# Patient Record
Sex: Male | Born: 1985 | Race: White | Hispanic: No | Marital: Married
Health system: Southern US, Community
[De-identification: ages and names within clinical notes are randomized; demographics above are authoritative.]

---

## 2019-07-03 ENCOUNTER — Other Ambulatory Visit: Payer: Self-pay

## 2019-07-03 ENCOUNTER — Encounter (HOSPITAL_BASED_OUTPATIENT_CLINIC_OR_DEPARTMENT_OTHER): Payer: Self-pay

## 2019-07-03 ENCOUNTER — Emergency Department (HOSPITAL_BASED_OUTPATIENT_CLINIC_OR_DEPARTMENT_OTHER): Payer: Self-pay

## 2019-07-03 ENCOUNTER — Emergency Department (HOSPITAL_BASED_OUTPATIENT_CLINIC_OR_DEPARTMENT_OTHER)
Admission: EM | Admit: 2019-07-03 | Discharge: 2019-07-03 | Disposition: A | Discharge status: Home / Self Care | Payer: Self-pay | Attending: Emergency Medicine | Admitting: Emergency Medicine

## 2019-07-03 DIAGNOSIS — R112 Nausea with vomiting, unspecified: Secondary | ICD-10-CM | POA: Insufficient documentation

## 2019-07-03 DIAGNOSIS — N23 Unspecified renal colic: Secondary | ICD-10-CM | POA: Insufficient documentation

## 2019-07-03 DIAGNOSIS — B356 Tinea cruris: Secondary | ICD-10-CM | POA: Insufficient documentation

## 2019-07-03 LAB — BASIC METABOLIC PANEL
Anion gap: 10 (ref 5–15)
BUN: 14 mg/dL (ref 6–20)
CO2: 26 mmol/L (ref 22–32)
Calcium: 9.4 mg/dL (ref 8.9–10.3)
Chloride: 103 mmol/L (ref 98–111)
Creatinine, Ser: 1.05 mg/dL (ref 0.61–1.24)
GFR calc Af Amer: >60 mL/min (ref >60)
GFR calc non Af Amer: >60 mL/min (ref >60)
Glucose, Bld: 140 mg/dL — ABNORMAL HIGH (ref 70–99)
Potassium: 4.4 mmol/L (ref 3.5–5.1)
Sodium: 139 mmol/L (ref 135–145)

## 2019-07-03 LAB — URINALYSIS, ROUTINE W REFLEX MICROSCOPIC
Bilirubin Urine: NEGATIVE
Glucose, UA: NEGATIVE mg/dL
Ketones, ur: NEGATIVE mg/dL
Leukocytes,Ua: NEGATIVE
Nitrite: NEGATIVE
Protein, ur: NEGATIVE mg/dL
Specific Gravity, Urine: 1.015 (ref 1.005–1.030)
pH: 8.5 — ABNORMAL HIGH (ref 5.0–8.0)

## 2019-07-03 LAB — CBC
HCT: 48.1 % (ref 39.0–52.0)
Hemoglobin: 15.2 g/dL (ref 13.0–17.0)
MCH: 27.4 pg (ref 26.0–34.0)
MCHC: 31.6 g/dL (ref 30.0–36.0)
MCV: 86.7 fL (ref 80.0–100.0)
Platelets: 312 10*3/uL (ref 150–400)
RBC: 5.55 MIL/uL (ref 4.22–5.81)
RDW: 12.2 % (ref 11.5–15.5)
WBC: 9.4 10*3/uL (ref 4.0–10.5)
nRBC: 0 % (ref 0.0–0.2)

## 2019-07-03 LAB — URINALYSIS, MICROSCOPIC (REFLEX)

## 2019-07-03 MED ORDER — ONDANSETRON HCL 4 MG/2ML IJ SOLN
INTRAMUSCULAR | Status: AC
  Administered 2019-07-03: 14:00:00 4 mg via INTRAVENOUS
  Filled 2019-07-03: qty 2

## 2019-07-03 MED ORDER — ONDANSETRON 4 MG PO TBDP: 4.0000 mg | ORAL_TABLET | Freq: Three times a day (TID) | ORAL | 0 refills | Status: AC | PRN

## 2019-07-03 MED ORDER — FENTANYL CITRATE (PF) 100 MCG/2ML IJ SOLN
INTRAMUSCULAR | Status: AC
  Administered 2019-07-03: 50 ug via INTRAVENOUS
  Filled 2019-07-03: qty 2

## 2019-07-03 MED ORDER — FENTANYL CITRATE (PF) 100 MCG/2ML IJ SOLN
50.0000 ug | INTRAMUSCULAR | Status: DC | PRN
  Administered 2019-07-03: 14:00:00 50 ug via INTRAVENOUS

## 2019-07-03 MED ORDER — CLOTRIMAZOLE 1 % EX CREA: TOPICAL_CREAM | CUTANEOUS | 0 refills | Status: AC

## 2019-07-03 MED ORDER — TRAMADOL HCL 50 MG PO TABS: 50.0000 mg | ORAL_TABLET | Freq: Four times a day (QID) | ORAL | 0 refills | Status: AC | PRN

## 2019-07-03 MED ORDER — ONDANSETRON HCL 4 MG/2ML IJ SOLN
4.0000 mg | Freq: Once | INTRAMUSCULAR | Status: AC
  Administered 2019-07-03: 14:00:00 4 mg via INTRAVENOUS

## 2019-07-03 MED FILL — CLOTRIMAZOLE 1% CREAM: 1 | 28 days supply | Qty: 28 | Fill #0

## 2019-07-03 MED FILL — traMADol HCL 50 MG TABS: 50 | 3 days supply | Qty: 15 | Fill #0

## 2019-07-03 MED FILL — ONDANSETRON ODT 4 MG TABLET: 4 | 6 days supply | Qty: 20 | Fill #0

## 2019-07-03 NOTE — ED Provider Notes (Signed)
[facility] EMERGENCY DEPARTMENT Provider Note   CSN: [id number] Arrival date & time: 07/03/19  1333     History   Chief Complaint Chief Complaint  Patient presents with   Flank Pain    HPI Jesse Cervantes is a 33 y.o. male   Abrupt onset of 10/10 crampy, constant left flank and left lower quadrant abdominal pain that is worse with movement that radiates into his groin.  Patient endorses "funny feeling"in his groin with a sensation of a mass in the area.  Endorses difficulty urinating, difficulty initiating stream, small volume of urine.  Endorses nausea and multiple episodes of vomiting that are nonbilious non-bloody.  No history of kidney stones no history of similar.  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  Denies repetitive movements or back issues in the past.  Denies fever, constipation, hematuria, numbness in his genital area, loss of bowel or bladder control, loss of sensation in lower extremities.     HPI  History reviewed. No pertinent past medical history.  There are no active problems to display for this patient.   History reviewed. No pertinent surgical history.      Home Medications    Prior to Admission medications   Medication Sig Start Date End Date Taking? Authorizing Provider  clotrimazole (LOTRIMIN) 1 % cream Apply to affected area 2 times daily 07/03/19   Inella Kuwahara S, PA  ondansetron (ZOFRAN ODT) 4 MG disintegrating tablet Take 1 tablet (4 mg total) by mouth every 8 (eight) hours as needed for nausea or vomiting. 07/03/19   Tedd Sias, PA  traMADol (ULTRAM) 50 MG tablet Take 1 tablet (50 mg total) by mouth every 6 (six) hours as needed. 07/03/19   Tedd Sias, PA    Family History No family history on file.  Social History Social History   Tobacco Use   Smoking status: Not on file  Substance Use Topics   Alcohol use:  Not on file   Drug use: Not on file     Allergies   Patient has no known allergies.   Review of Systems Review of Systems  All other systems reviewed and are negative.    Physical Exam Updated Vital Signs BP (!) 124/92 (BP Location: Left Arm)    Pulse 80    Temp 98.2 F (36.8 C)    Resp (!) 24    Ht 5\' 9"  (1.753 m)    Wt 82.1 kg    SpO2 98%    BMI 26.73 kg/m   Physical Exam Vitals signs and nursing note reviewed. Exam conducted with a chaperone present.  Constitutional:      Appearance: He is not ill-appearing.  HENT:     Head: Normocephalic and atraumatic.     Mouth/Throat:     Mouth: Mucous membranes are moist.  Eyes:     General: No scleral icterus. Neck:     Musculoskeletal: No neck rigidity.  Cardiovascular:     Rate and Rhythm: Normal rate and regular rhythm.     Pulses: Normal pulses.     Heart sounds: Normal heart sounds.  Pulmonary:     Effort: Pulmonary effort is normal.     Breath sounds: Normal breath sounds.  Abdominal:     General: Abdomen is flat. Bowel sounds are normal. There is no distension.     Palpations: Abdomen is soft.     Tenderness: There  is abdominal tenderness (Left lower quadrant and left flank). There is no right CVA tenderness, left CVA tenderness, guarding or rebound.     Comments: Tenderness to palpation of the left flank with negative CVA tenderness.  Genitourinary:    Comments: Intertriginous groin region is discolored, darker than surrounding skin with some flaky skin present.  No testicular pain, penile pain, no penile discharge  No hernia or mass on exam.  Mild tenderness to palpation in left groin. Musculoskeletal:     Right lower leg: No edema.     Left lower leg: No edema.  Skin:    General: Skin is warm and dry.     Capillary Refill: Capillary refill takes less than 2 seconds.  Neurological:     Mental Status: He is alert. Mental status is at baseline.  Psychiatric:        Behavior: Behavior normal.      ED  Treatments / Results  Labs (all labs ordered are listed, but only abnormal results are displayed) Labs Reviewed  URINALYSIS, ROUTINE W REFLEX MICROSCOPIC - Abnormal; Notable for the following components:      Result Value   pH 8.5 (*)    Hgb urine dipstick TRACE (*)    All other components within normal limits  BASIC METABOLIC PANEL - Abnormal; Notable for the following components:   Glucose, Bld 140 (*)    All other components within normal limits  URINALYSIS, MICROSCOPIC (REFLEX) - Abnormal; Notable for the following components:   Bacteria, UA RARE (*)    All other components within normal limits  CBC    EKG None  Radiology Ct Renal Stone Study  Result Date: 07/03/2019 CLINICAL DATA:  Pt c/o sudden onset of L flank pain followed by N/V. Pt c/o difficulty with urination, no other complaints EXAM: CT ABDOMEN AND PELVIS WITHOUT CONTRAST TECHNIQUE: Multidetector CT imaging of the abdomen and pelvis was performed following the standard protocol without IV contrast. COMPARISON:  None. FINDINGS: Lower chest: Small hiatal hernia. Somewhat limited evaluation of the abdominal parenchyma given lack of IV contrast. Hepatobiliary: No focal liver abnormality is seen. No gallstones, gallbladder wall thickening, or biliary dilatation. Pancreas: Unremarkable. Spleen: Normal in size without focal abnormality. Adrenals/Urinary Tract: Adrenal glands are unremarkable. Multiple punctate calculi in the right kidney. No right hydronephrosis. There are multiple punctate calculi in the left kidney. There is mild left hydronephrosis and mild dilation of the left ureter without an obstructing stone visualized. There is a 2 mm calculus in the bladder. Stomach/Bowel: Stomach is within normal limits. Appendix appears normal. No evidence of bowel wall thickening, distention, or inflammatory changes. Vascular/Lymphatic: No significant vascular findings are present. No enlarged abdominal or pelvic lymph nodes. Reproductive:  Prostate is unremarkable. Other: No abdominal wall hernia or abnormality. No abdominopelvic ascites. Musculoskeletal: No acute or significant osseous findings. IMPRESSION: 1. Mild left hydronephrosis and mild dilation of the left ureter without an obstructing stone visualized. There is a 2 mm calculus in the bladder, which may have recently passed from the left ureter. 2. Nonobstructing right nephrolithiasis. 3. Small hiatal hernia. Electronically Signed   By: Emmaline KluverNancy  Ballantyne M.D.   On: 07/03/2019 14:44    Procedures Procedures (including critical care time)  Medications Ordered in ED Medications  fentaNYL (SUBLIMAZE) injection 50 mcg (50 mcg Intravenous Given 07/03/19 1352)  ondansetron (ZOFRAN) injection 4 mg (4 mg Intravenous Given 07/03/19 1352)     Initial Impression / Assessment and Plan / ED Course  I have reviewed the  triage vital signs and the nursing notes.  Pertinent labs & imaging results that were available during my care of the patient were reviewed by me and considered in my medical decision making (see chart for details).        Patient is a 89-year-ol male presenting with left flank pain with radiation into his abdomen and pelvis with nausea and multiple episodes of vomiting  Suspect kidney stone.  CT scan to rule in.    Pain may also be due to muscular strain as there is palpable tenderness in the left flank without CVA tenderness. 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 Will recommend follow up with PCP for evaluation of this.   CT scan shows small stone in bladder likely passed recently. Mild left sided hydronephrosis and ureteral dilation likely persistent from recently passed kidney stone. Discussed with patient turn precautions including fever, intractable nausea vomiting, worsening pain or new or concerning symptoms.  Patient will follow-up with primary care within the week for reevaluation and  for evaluation of cronic tinea cruris--prescribed lotrimin for patient to use in the meantime.  Patient given recommendations for PCP.       The patient appears reasonably screened and/or stabilized for discharge and I doubt any other medical condition or other [facility] requiring further screening, evaluation, or treatment in the ED at this time prior to discharge.  Patient is hemodynamically stable, in NAD, and able to ambulate in the ED. Pain has been managed or a plan has been made for home management and has no complaints prior to discharge. Patient is comfortable with above plan and is stable for discharge at this time. All questions were answered prior to disposition. Results from the ER workup discussed with the patient face to face and all questions answered to the best of my ability. The patient is safe for discharge with strict return precautions. Patient appears safe for discharge with appropriate follow-up.  Conveyed my impression with the patient and he voiced understanding and is agreeable to plan.   An After Visit Summary was printed and given to the patient.  Portions of this note were generated with Scientist, clinical (histocompatibility and immunogenetics). Dictation errors may occur despite best attempts at proofreading.       Final Clinical Impressions(s) / ED Diagnoses   Final diagnoses:  Ureteral colic  Tinea cruris    ED Discharge Orders         Ordered    traMADol (ULTRAM) 50 MG tablet  Every 6 hours PRN     07/03/19 1514    ondansetron (ZOFRAN ODT) 4 MG disintegrating tablet  Every 8 hours PRN     07/03/19 1514    clotrimazole (LOTRIMIN) 1 % cream     07/03/19 1514           Gailen Shelter, [place] 07/03/19 1518    Raeford Razor, MD 07/04/19 [phone]

## 2019-07-03 NOTE — ED Notes (Signed)
ED Provider at bedside. 

## 2019-07-03 NOTE — ED Triage Notes (Signed)
Pt c/o sudden onset of L flank pain followed by N/V. Pt c/o difficulty with urination.

## 2019-07-03 NOTE — Discharge Instructions (Signed)
Please establish and follow-up with primary care provider within next week.  Please see attached resource guide.  Please use Zofran and tramadol for nausea and pain respectively.  Use Tylenol ibuprofen for pain control during the day.  Be aware that all may make you drowsy and should not be mixed with alcohol.  Please return if you have any fevers, intractable vomiting, worsening or concerning symptoms as discussed.    RESOURCE GUIDE   No Primary Care Doctor: Call [facility]  [phone] - can help you locate a primary care doctor that  accepts your insurance, provides certain services, etc. Physician Referral Service- [phone]  Agencies that provide inexpensive medical care: [facility]  [phone] [facility] Internal Medicine  [phone] [facility]  [phone] [facility]  [phone] [facility]  [phone] [facility]  [phone]  Chronic Pain Problems: Contact [facility]  [phone] Patients need to be referred by their primary care doctor.  Insufficient Money for Medicine: Contact United Way:  call "211" or [facility] [phone].  Medicaid-accepting [facility] Providers: [facility]- [address]  [phone], Mon-Fri 9am-7pm, Sat 9am-1pm [facility]- [address] [place], [address] [place]  [phone] [employer]- [address], [address] [place]  [phone] [facility]- [address]  [phone] Renaye Rakers- [address] [place], [address], [phone]  Only accepts [place] patients after they have their name  applied to their card  Self Pay (no insurance) in [facility]: Sickle Cell Patients: Dr Willey Blade, [facility] Internal Medicine  [address] [place], [phone] [facility]- [address] [place]  [phone]       [facility] [place]- [address], [address]       -     [facility]- see information above (Speak to [employer] if you do not have insurance)       -  [facility]- [address] [place], [phone]       -  [facility]- [address],  [phone]       -  [facility]- [address], [phone]       -  Dr Julio Sicks-  [address], [place], [phone]       -  [facility]- [address], [phone]       -  [facility]- [address], [phone], also [address] [phone]       -    [facility]- [address] [place], [phone], 1st & 3rd Saturday   every month, 10am-1pm  1) Find a Doctor and Pay Out of Pocket Although you won't have to find out who is covered by your insurance plan, it is a good idea to ask around and get recommendations. You will then need to call the office and see if the doctor you have chosen will accept you as a new patient and what types of options they offer for patients who are self-pay. Some doctors offer discounts or will set up payment plans for their patients who do not have insurance, but you will need to ask so you aren't surprised when you get to your appointment.  2) Contact Your Local Health Department Not all health departments have  doctors that can see patients for sick visits, but many do, so it is worth a call to see if yours does. If you don't know where your local health department is, you can check in your phone book. The CDC also has a tool to help you locate your state's health department, and many state websites also have listings of all of their local health departments.  3) Find a Walk-in Clinic If your illness is not likely to be very severe or complicated, you may want to try a walk in clinic. These are popping up all over the country in pharmacies, drugstores, and shopping centers. They're usually staffed by nurse practitioners or physician assistants that have been trained to treat common illnesses  and complaints. They're usually fairly quick and inexpensive. However, if you have serious medical issues or chronic medical problems, these are probably not your best option  STD Testing [facility] of [facility] [place], STD Clinic, [address], [place], phone [phone] or [phone].  Monday - Friday, call for an appointment. [facility] of [facility] [place], STD Clinic, [place], [place], phone [phone] or [phone].  Monday - Friday, call for an appointment.  Abuse/Neglect: [facility] Child Abuse Hotline [phone](336) 760-796-1961 [facility] Child Abuse Hotline (985)307-2127323-794-2927 (After Hours)  Emergency Shelter:  [facility] [phone](336) [phone]  Maternity Homes: [facility] [phone]([phone]) [phone] [facility] [phone]) [phone]  MRSA Hotline #:   (909)078-4391762 056 7649  [facility] Resources  [facility] [place]  United Way [facility]. [address]                 [address]         [address]  [place]                                               [place]                              [place] Phone:  [phone]                                  Phone:  [phone]                   Phone:  [phone]  [facility], [phone] [facility] Services - [facility]- [phone]       -     [facility] in [place], [address],                                  [phone], [facility]  [place] Child Abuse Hotline (870)195-9826(336) (306)422-6560 or (518)025-0232(336) 626-008-0051 (After Hours)   Behavioral Health Services  Substance Abuse Resources: [facility] Drug Services  [phone] [facility] [phone] [facility] [phone] [facility] [phone] Residential & Outpatient Substance Abuse Program  [phone]  Psychological Services: [facility]   [phone] [facility]  [phone] [facility] [place] [facility], [phone] [place] [address], [place], ACCESS LINE: [phone] or [phone], [email or web address]  Dental Assistance  If  unable to pay or uninsured, contact:  [place] or [facility]. to become qualified for the adult dental clinic.  Patients with Medicaid: [facility] [phone] [address], [place] [address] [phone]  If unable to pay, or uninsured, contact [facility] [phone]) or [place] [phone] in [place], [place] in [facility]) to become qualified for the adult dental clinic   Other [place]- [place], [place], [place], [address], [place], [place], 2nd and 4th Thursday of the month at 6:30am.  10 clients each day by appointment, can sometimes see walk-in patients if someone does not show for an appointment. [facility]- [address] [place]ard Danker [place], [place], [address], [place], [place], [place], [address], [place] Department- [phone] [place] [facility] [phone]

## 2020-12-28 IMAGING — CT CT RENAL STONE PROTOCOL
2 of 4 series · 15 of 46 positions shown, 17 images · non-contrast
Comparison: None.

CLINICAL DATA: Pt c/o sudden onset of L flank pain followed by N/V.
Pt c/o difficulty with urination, no other complaints

EXAM:
CT ABDOMEN AND PELVIS WITHOUT CONTRAST
TECHNIQUE: Multidetector CT imaging of the abdomen and pelvis was performed
following the standard protocol without IV contrast.

[Series 2: axial st · axial · 0.81mm/px · z∈[-581,-76]mm · 12 of 111 slices shown, 14 images]
[im 5/111  soft-tissue]
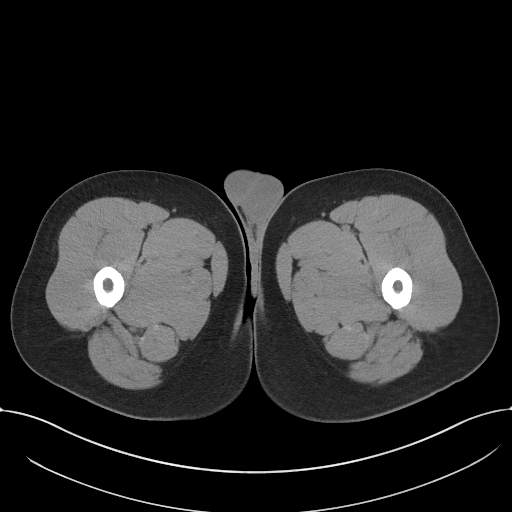
[im 5/111  bone]
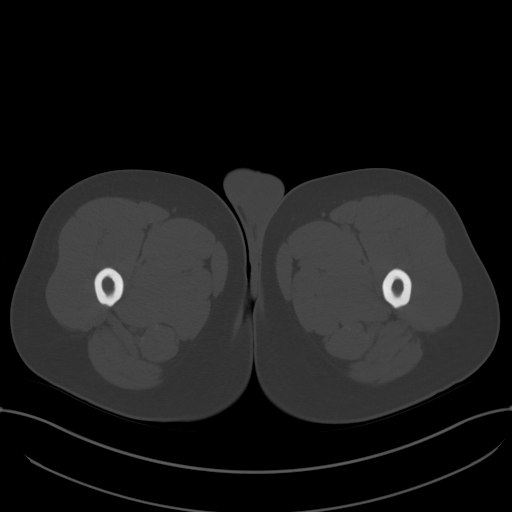
[im 14/111  soft-tissue]
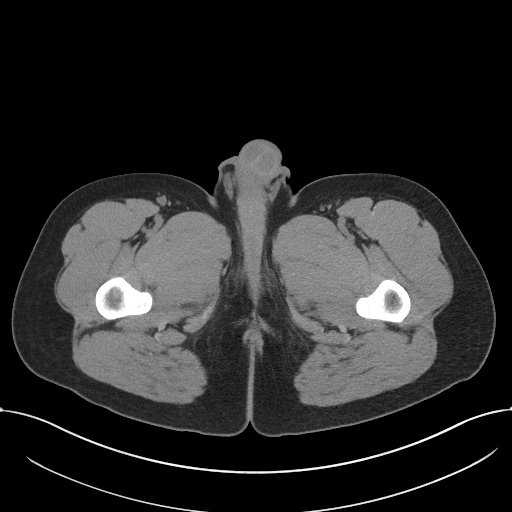
[im 23/111  soft-tissue]
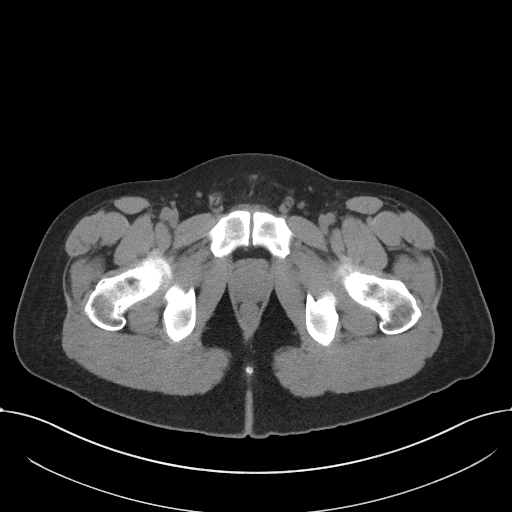
[im 33/111  soft-tissue]
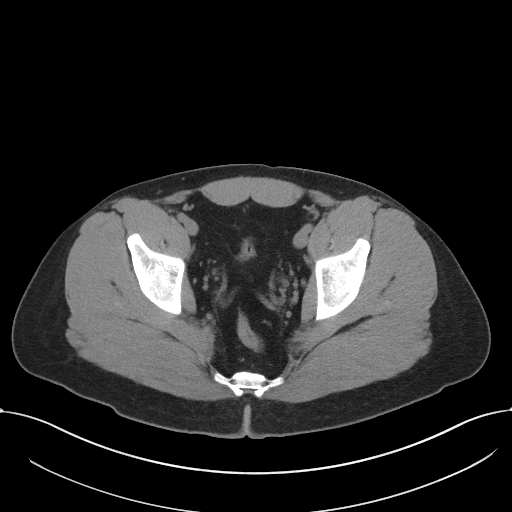
[im 42/111  soft-tissue]
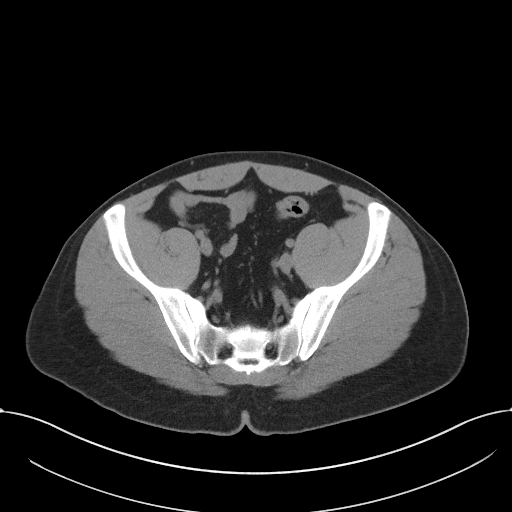
[im 51/111  soft-tissue]
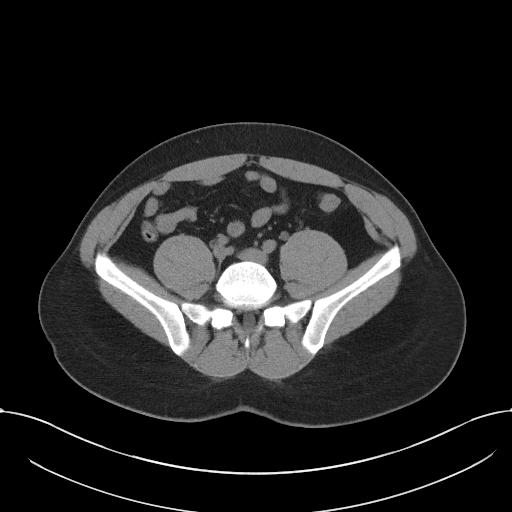
[im 60/111  soft-tissue]
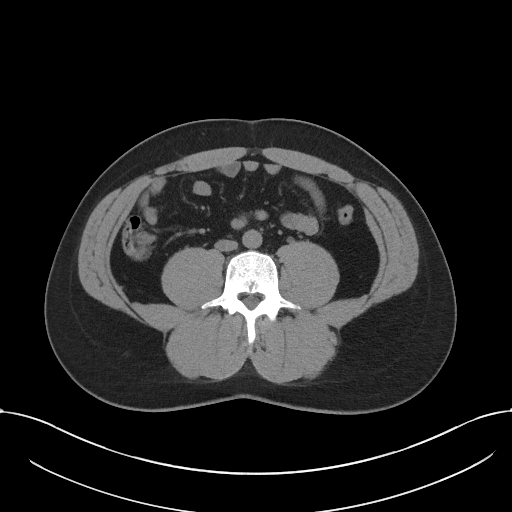
[im 69/111  soft-tissue]
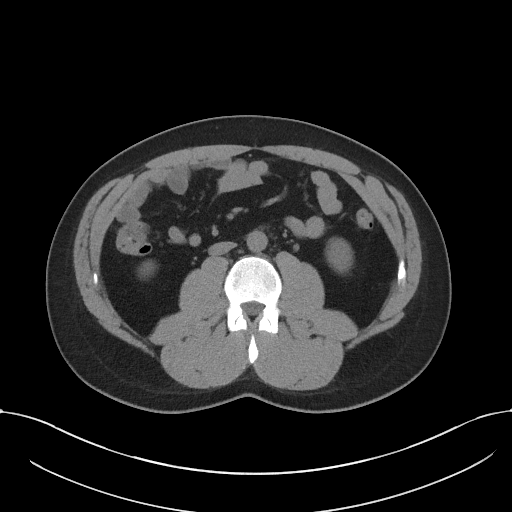
[im 78/111  soft-tissue]
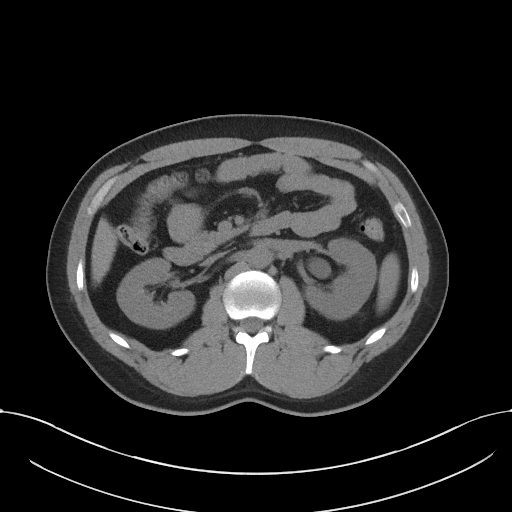
[im 78/111  bone]
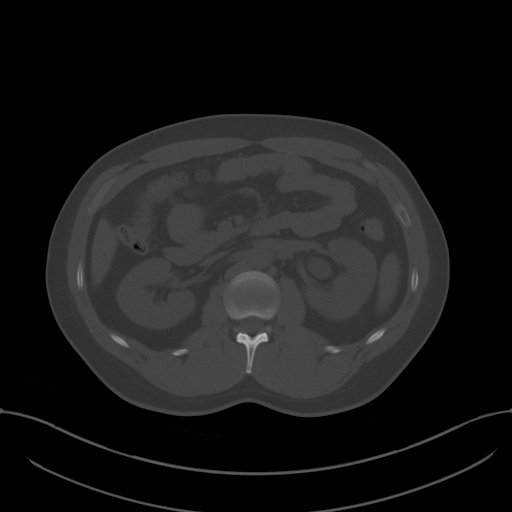
[im 88/111  soft-tissue]
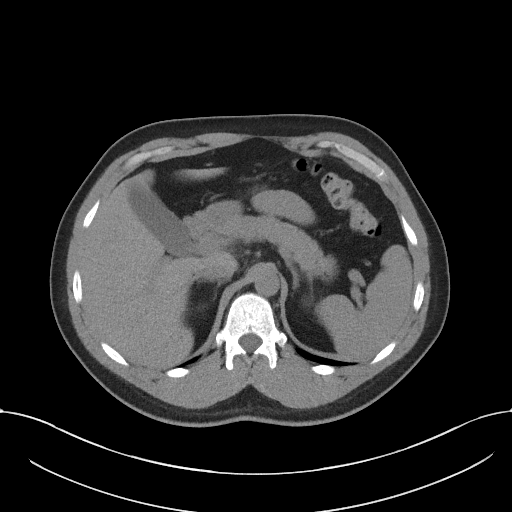
[im 97/111  soft-tissue]
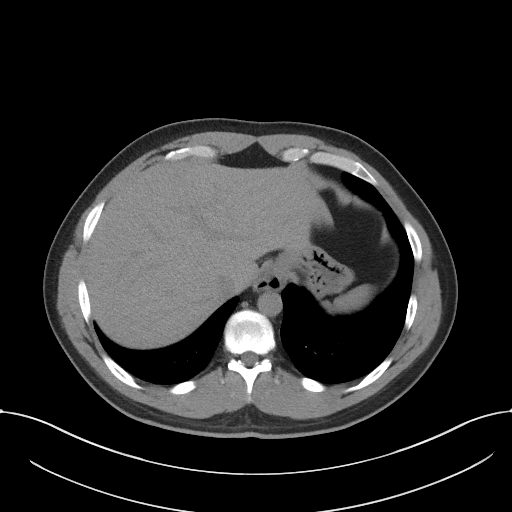
[im 106/111  soft-tissue]
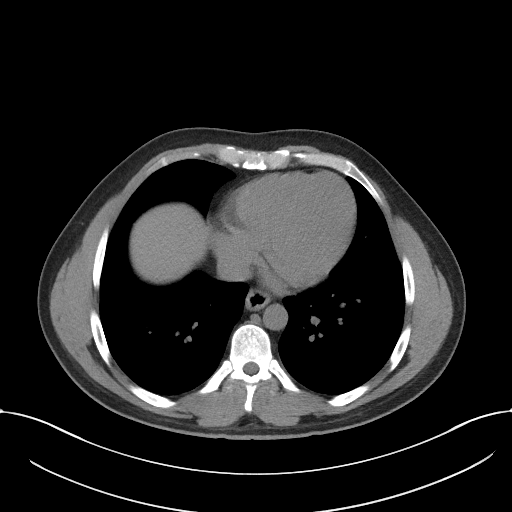

[Series 5: coronal st · coronal · 0.70mm/px · 3 of 88 slices shown]
[im 30/88  soft-tissue]
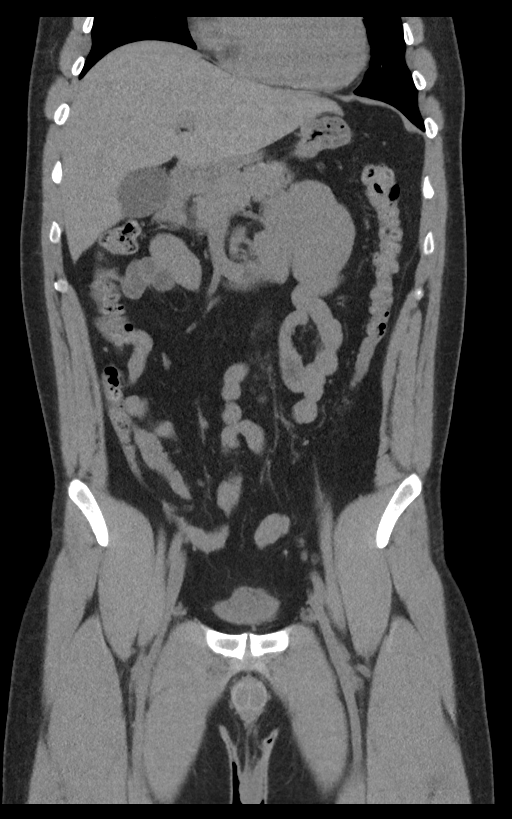
[im 39/88  soft-tissue]
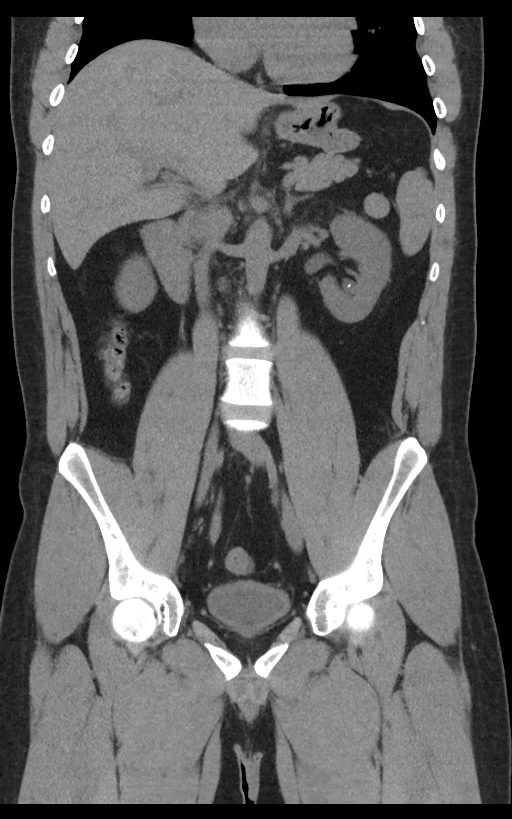
[im 49/88  soft-tissue]
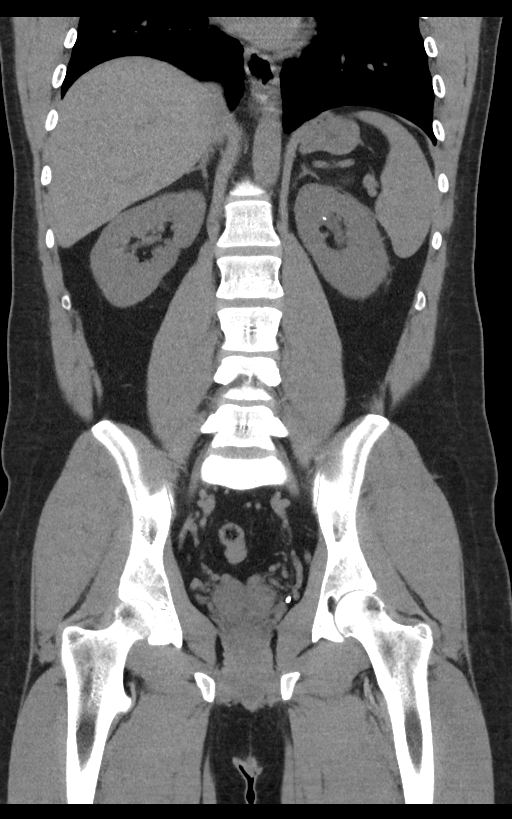

[15 of 46 positions shown; findings below may reference images not displayed]

FINDINGS: Lower chest: Small hiatal hernia.

Somewhat limited evaluation of the abdominal parenchyma given lack
of IV contrast.

Hepatobiliary: No focal liver abnormality is seen. No gallstones,
gallbladder wall thickening, or biliary dilatation.

Pancreas: Unremarkable.

Spleen: Normal in size without focal abnormality.

Adrenals/Urinary Tract: Adrenal glands are unremarkable. Multiple
punctate calculi in the right kidney. No right hydronephrosis. There
are multiple punctate calculi in the left kidney. There is mild left
hydronephrosis and mild dilation of the left ureter without an
obstructing stone visualized. There is a 2 mm calculus in the
bladder.

Stomach/Bowel: Stomach is within normal limits. Appendix appears
normal. No evidence of bowel wall thickening, distention, or
inflammatory changes.

Vascular/Lymphatic: No significant vascular findings are present. No
enlarged abdominal or pelvic lymph nodes.

Reproductive: Prostate is unremarkable.

Other: No abdominal wall hernia or abnormality. No abdominopelvic
ascites.

Musculoskeletal: No acute or significant osseous findings.
IMPRESSION: 1. Mild left hydronephrosis and mild dilation of the left ureter
without an obstructing stone visualized. There is a 2 mm calculus in
the bladder, which may have recently passed from the left ureter.
2. Nonobstructing right nephrolithiasis.
3. Small hiatal hernia.
